# Patient Record
Sex: Female | Born: 1950 | Marital: Single | Smoking: Never smoker
Health system: Southern US, Community
[De-identification: ages and names within clinical notes are randomized; demographics above are authoritative.]

## PROBLEM LIST (undated history)

## (undated) DIAGNOSIS — I10 Essential (primary) hypertension: Secondary | ICD-10-CM

---

## 2013-06-03 ENCOUNTER — Encounter (HOSPITAL_COMMUNITY): Payer: Self-pay | Admitting: Emergency Medicine

## 2013-06-03 DIAGNOSIS — G8929 Other chronic pain: Secondary | ICD-10-CM | POA: Insufficient documentation

## 2013-06-03 DIAGNOSIS — M25569 Pain in unspecified knee: Secondary | ICD-10-CM | POA: Insufficient documentation

## 2013-06-03 DIAGNOSIS — Z8781 Personal history of (healed) traumatic fracture: Secondary | ICD-10-CM | POA: Insufficient documentation

## 2013-06-03 DIAGNOSIS — I10 Essential (primary) hypertension: Secondary | ICD-10-CM | POA: Insufficient documentation

## 2013-06-03 NOTE — ED Notes (Signed)
Pt. reports chronic  bilateral knee pain with swelling for 2 years worse when walking , denies injury or fall.

## 2013-06-04 ENCOUNTER — Emergency Department (HOSPITAL_COMMUNITY): Payer: Self-pay

## 2013-06-04 ENCOUNTER — Emergency Department (HOSPITAL_COMMUNITY)
Admission: EM | Admit: 2013-06-04 | Discharge: 2013-06-04 | Disposition: A | Discharge status: Home / Self Care | Payer: Self-pay | Attending: Emergency Medicine | Admitting: Emergency Medicine

## 2013-06-04 DIAGNOSIS — M25561 Pain in right knee: Secondary | ICD-10-CM

## 2013-06-04 DIAGNOSIS — M25562 Pain in left knee: Secondary | ICD-10-CM

## 2013-06-04 DIAGNOSIS — G8929 Other chronic pain: Secondary | ICD-10-CM

## 2013-06-04 DIAGNOSIS — S82142A Displaced bicondylar fracture of left tibia, initial encounter for closed fracture: Secondary | ICD-10-CM

## 2013-06-04 DIAGNOSIS — S82141A Displaced bicondylar fracture of right tibia, initial encounter for closed fracture: Secondary | ICD-10-CM

## 2013-06-04 HISTORY — DX: Essential (primary) hypertension: I10

## 2013-06-04 MED ORDER — IBUPROFEN 400 MG PO TABS
800.0000 mg | ORAL_TABLET | Freq: Once | ORAL | Status: AC
  Administered 2013-06-04: 800 mg via ORAL
  Filled 2013-06-04: qty 2

## 2013-06-04 MED ORDER — MELOXICAM 15 MG PO TABS: 15.0000 mg | ORAL_TABLET | Freq: Every day | ORAL | Status: AC

## 2013-06-04 NOTE — ED Notes (Signed)
Pt's husband reports that pt has had bilateral knee pain for two years. States pt saw an MD while in [place] but has not seen one since being in the [place] for past two years. Reports increased pain yesterday more on the right leg. Came to the ER for further evaluation.

## 2013-06-04 NOTE — Discharge Instructions (Signed)
You were seen and evaluated for your continued knee pains. Your x-rays today show evidence of old fractures to the bones in both of your knees. This may be causing your continued pains and arthritis symptoms. Please followup with a primary care provider or an orthopedic specialist for continued evaluation and treatment.    Knee Pain Knee pain can be a result of an injury or other medical conditions. Treatment will depend on the cause of your pain. HOME CARE  Only take medicine as told by your doctor.  Keep a healthy weight. Being overweight can make the knee hurt more.  Stretch before exercising or playing sports.  If there is constant knee pain, change the way you exercise. Ask your doctor for advice.  Make sure shoes fit well. Choose the right shoe for the sport or activity.  Protect your knees. Wear kneepads if needed.  Rest when you are tired. GET HELP RIGHT AWAY IF:   Your knee pain does not stop.  Your knee pain does not get better.  Your knee joint feels hot to the touch.  You have a fever. MAKE SURE YOU:   Understand these instructions.  Will watch this condition.  Will get help right away if you are not doing well or get worse. Document Released: 05/24/2008 Document Revised: 05/20/2011 Document Reviewed: 05/24/2008 [facility] Patient Information 2014 [employer] [place].

## 2013-06-04 NOTE — ED Provider Notes (Signed)
Medical screening examination/treatment/procedure(s) were performed by non-physician practitioner and as supervising physician I was immediately available for consultation/collaboration.   EKG Interpretation None        Gwyneth SproutWhitney Syair Fricker, MD 06/04/13 0700

## 2013-06-04 NOTE — ED Provider Notes (Signed)
CSN: [id number]     Arrival date & time 06/03/13  2225 History   First MD Initiated Contact with Patient 06/04/13 0045     Chief Complaint  Patient presents with  . Knee Pain   HPI  History provided by the patient and cousin. Patient is a 63 year old female originally from [place] presenting with complaints of persistent bilateral knee pain. Patient has had knee pain and problems from 2 years or more. She does walk with a cane due to the pain. She has been living in [place] for the past 2 years. Since that time she has not been evaluated for her continued knee pain. Her cousin states that she was evaluated in [place] in May and had x-rays at one time but he does not think that they showed any significant findings. She does use an over-the-counter medication but they are unsure of the name and this does not help significantly. Pain is worse with standing and walking. She denies any recent injury or fall. No other aggravating or alleviating factors. Denies any swelling of the lower legs or calf. No chest pain or shortness of breath. No fever, chills or sweats.    Past Medical History  Diagnosis Date  . Hypertension    History reviewed. No pertinent past surgical history. No family history on file. History  Substance Use Topics  . Smoking status: Never Smoker   . Smokeless tobacco: Not on file  . Alcohol Use: No   OB History   Grav Para Term Preterm Abortions TAB SAB Ect Mult Living                 Review of Systems  Constitutional: Negative for fever.  Respiratory: Negative for shortness of breath.   Cardiovascular: Negative for chest pain.  Musculoskeletal:       Knee pain  All other systems reviewed and are negative.      Allergies  Review of patient's allergies indicates no known allergies.  Home Medications  No current outpatient prescriptions on file. Pulse 104  Temp(Src) 98.3 F (36.8 C) (Oral)  Resp 14  Ht 5\' 6"  (1.676 m)  Wt 208 lb (94.348 kg)  BMI  33.59 kg/m2  SpO2 100% Physical Exam  Nursing note and vitals reviewed. Constitutional: She is oriented to person, place, and time. She appears well-developed and well-nourished. No distress.  HENT:  Head: Normocephalic.  Cardiovascular: Normal rate and regular rhythm.   Pulmonary/Chest: Effort normal and breath sounds normal. No respiratory distress. She has no wheezes. She has no rales.  Abdominal: Soft. There is no tenderness. There is no rebound and no guarding.  Musculoskeletal: Normal range of motion. She exhibits tenderness. She exhibits no edema.  Normal passive range of motion of bilateral knees. There is tenderness diffusely with palpation to the anterior aspect. Patient also reports pain with passive range of motion. No crepitus. Negative anterior posterior drawer test. No increased laxity with valgus or varus stress. Neurovascularly intact distally bilaterally. Normal distal strength.  Neurological: She is alert and oriented to person, place, and time.  Skin: Skin is warm and dry. No rash noted.  Psychiatric: She has a normal mood and affect. Her behavior is normal.    ED Course  Procedures   DIAGNOSTIC STUDIES: Oxygen Saturation is 100% on room air.    COORDINATION OF CARE:  Nursing notes reviewed. Vital signs reviewed. Initial pt interview and examination performed.   12:58 AM-patient seen and evaluated. She appears well sitting calmly in no acute  distress. Does not appear in significant pain or discomfort. Symptoms have been chronic Discussed work up plan with pt at bedside, which includes  . Pt agrees with plan.  X-rays reviewed.  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  Will plan to give orthopedic referral.   Treatment plan initiated: Medications  ibuprofen (ADVIL,MOTRIN) tablet 800 mg (not administered)    Imaging Review Dg Knee Complete 4 Views Left  06/04/2013   CLINICAL DATA:  Pain for 2 years in both knees.  EXAM: LEFT KNEE - COMPLETE 4+ VIEW   COMPARISON:  None.  FINDINGS: There is evidence of a chronic, minimally displaced right tibial plateau fracture. There is moderate medial femorotibial joint space narrowing and marginal spurring. No definite acute fracture is identified. No knee joint effusion is seen. Small ossicles project in the region of the medial tibial spine.  IMPRESSION: No acute osseous abnormality identified. Chronic medial tibial plateau fracture.   Electronically Signed   By: Sebastian AcheAllen  Grady   On: 06/04/2013 03:02   Dg Knee Complete 4 Views Right  06/04/2013   CLINICAL DATA:  Knee pain.  EXAM: RIGHT KNEE - COMPLETE 4+ VIEW  COMPARISON:  None.  FINDINGS: A small to moderate knee joint effusion is present. Patellofemoral osteophytosis is present. There is a 2 cm osseous fragment at the medial aspect of the medial tibial plateau. Some of the margins appear sclerotic. There is no dislocation. No soft tissue abnormality is seen.  IMPRESSION: Knee joint effusion. Minimally displaced medial tibial plateau fracture, possibly chronic.   Electronically Signed   By: Sebastian AcheAllen  Grady   On: 06/04/2013 02:51     MDM   Final diagnoses:  Bilateral chronic knee pain  Tibial plateau fracture, left  Tibial plateau fracture, right       Angus Sellereter S Annisten [place], PA-C 06/04/13 0407

## 2015-04-16 IMAGING — CR DG KNEE COMPLETE 4+V*R*
4 series · 4 of 4 positions shown · non-contrast
Comparison: None.

CLINICAL DATA: Knee pain.

EXAM:
RIGHT KNEE - COMPLETE 4+ VIEW

[t knee ap right]
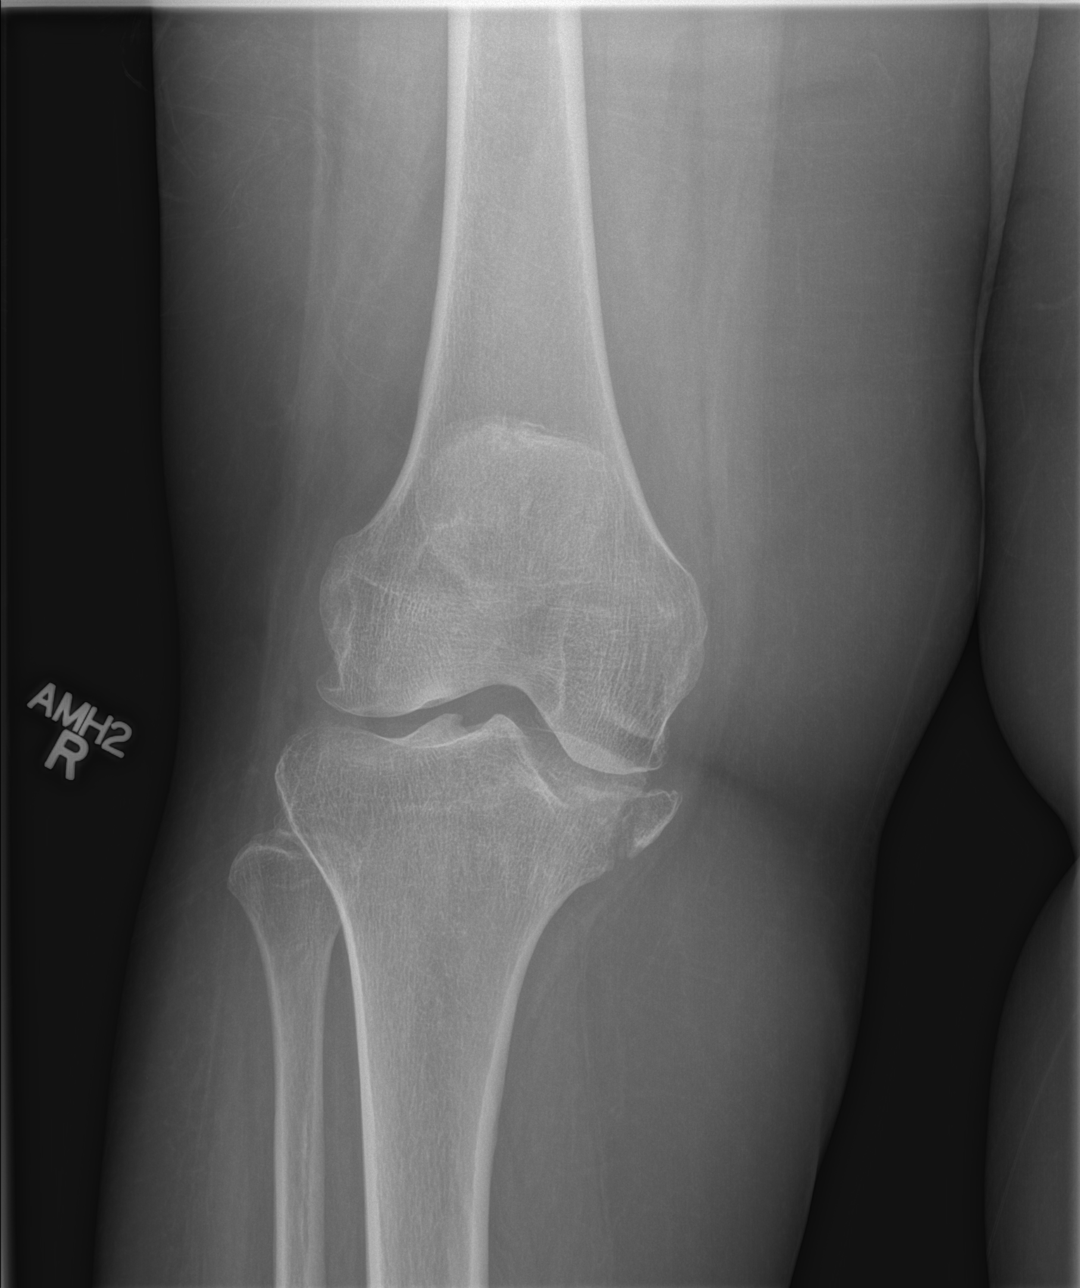

[t knee obl right (1 of 2)]
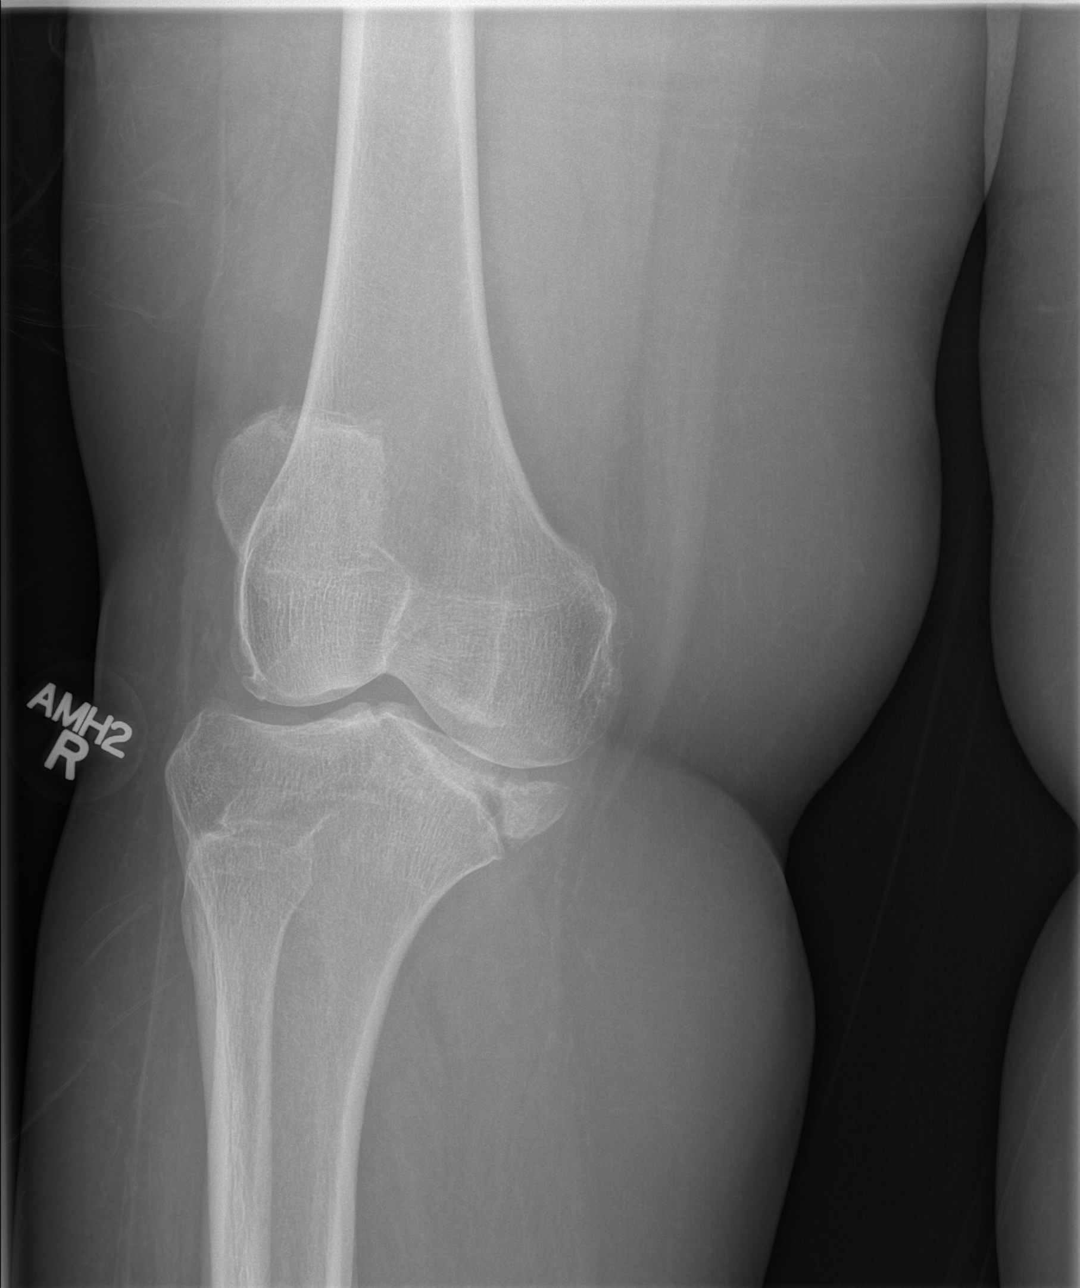

[t knee obl right (2 of 2)]
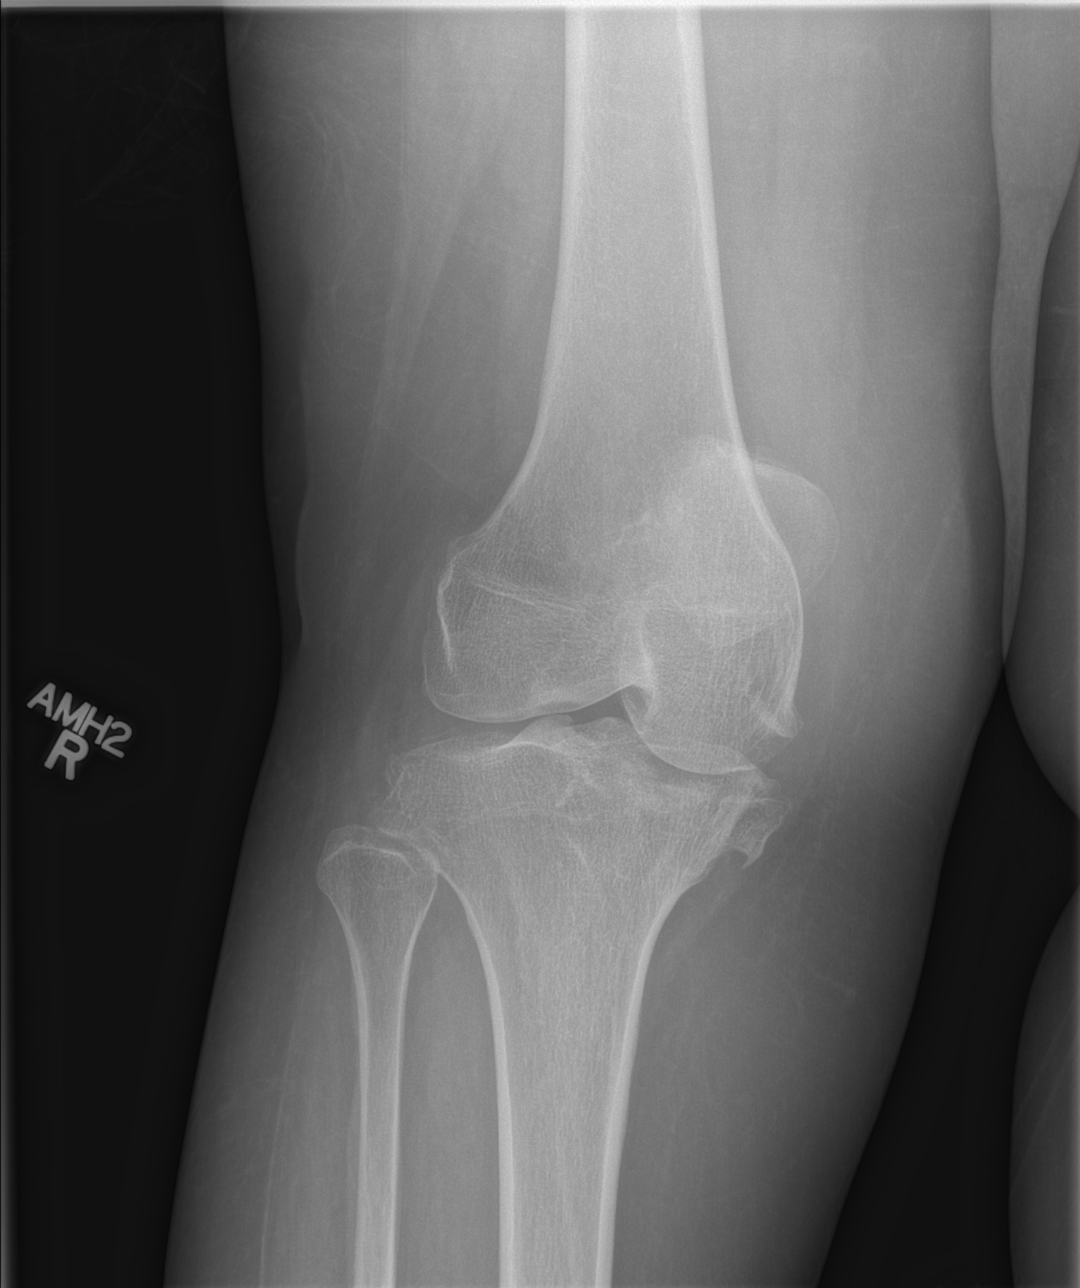

[t knee lat right]
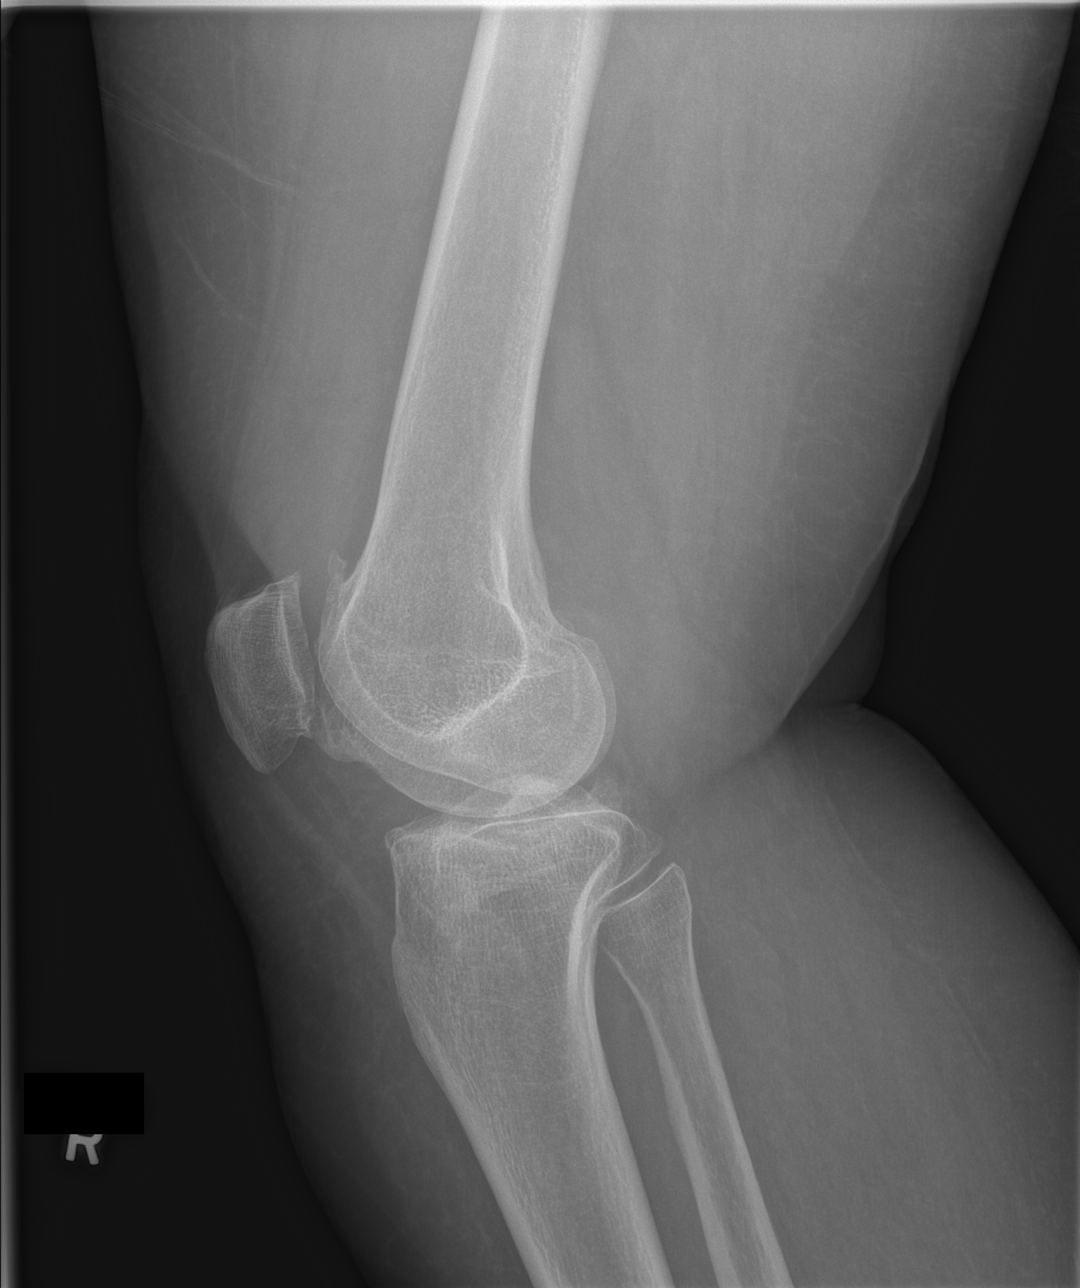

[4 of 4 positions shown; findings below may reference images not displayed]

FINDINGS: A small to moderate knee joint effusion is present. Patellofemoral
osteophytosis is present. There is a 2 cm osseous fragment at the
medial aspect of the medial tibial plateau. Some of the margins
appear sclerotic. There is no dislocation. No soft tissue
abnormality is seen.
IMPRESSION: Knee joint effusion. Minimally displaced medial tibial plateau
fracture, possibly chronic.
